# Patient Record
Sex: Male | Born: 1970 | Race: White | Hispanic: No | Marital: Married | Smoking: Never smoker
Health system: Southern US, Community
[De-identification: ages and names within clinical notes are randomized; demographics above are authoritative.]

## PROBLEM LIST (undated history)

## (undated) DIAGNOSIS — I10 Essential (primary) hypertension: Secondary | ICD-10-CM

## (undated) DIAGNOSIS — K219 Gastro-esophageal reflux disease without esophagitis: Secondary | ICD-10-CM

## (undated) HISTORY — DX: Gastro-esophageal reflux disease without esophagitis: K21.9

## (undated) HISTORY — DX: Essential (primary) hypertension: I10

---

## 2010-03-10 ENCOUNTER — Ambulatory Visit
Admission: RE | Admit: 2010-03-10 | Discharge: 2010-03-10 | Payer: Self-pay | Source: Home / Self Care | Attending: Internal Medicine | Admitting: Internal Medicine

## 2016-07-29 ENCOUNTER — Encounter (INDEPENDENT_AMBULATORY_CARE_PROVIDER_SITE_OTHER): Payer: Self-pay

## 2016-07-29 ENCOUNTER — Ambulatory Visit (INDEPENDENT_AMBULATORY_CARE_PROVIDER_SITE_OTHER): Payer: BLUE CROSS/BLUE SHIELD

## 2016-07-29 ENCOUNTER — Encounter (INDEPENDENT_AMBULATORY_CARE_PROVIDER_SITE_OTHER): Payer: Self-pay | Admitting: Orthopaedic Surgery

## 2016-07-29 ENCOUNTER — Ambulatory Visit (INDEPENDENT_AMBULATORY_CARE_PROVIDER_SITE_OTHER): Payer: BLUE CROSS/BLUE SHIELD | Admitting: Orthopaedic Surgery

## 2016-07-29 VITALS — BP 134/85 | HR 59 | Ht 74.0 in | Wt 210.0 lb

## 2016-07-29 DIAGNOSIS — G5602 Carpal tunnel syndrome, left upper limb: Secondary | ICD-10-CM

## 2016-07-29 DIAGNOSIS — M542 Cervicalgia: Secondary | ICD-10-CM

## 2016-07-29 DIAGNOSIS — G5601 Carpal tunnel syndrome, right upper limb: Secondary | ICD-10-CM | POA: Diagnosis not present

## 2016-07-29 NOTE — Progress Notes (Signed)
Office Visit Note   Patient: Albert Williams           Date of Birth: 1970/12/17           MRN: [id number] Visit Date: 07/29/2016              Requested by: Georgianne Fick, MD [address] [place], [place] [address] PCP: Georgianne Fick, MD   Assessment & Plan: Visit Diagnoses:  1. Neck pain   2. Carpal tunnel syndrome, left upper limb   3. Carpal tunnel syndrome, right upper limb   4.      Low back pain. 5.      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 He wakes him up at night he's used the splints without relief. We'll proceed with nerve conduction velocities since his old the reports are not available. We'll proceed with the cervical MRI scan for evaluation of his increased neck shoulder pain with progressive weakness. He previously had the H&P at C5-6 2013. Follow-up after tests. He expressed that if something to be done help with his hand finger pain wakes him up and 9 as well as his neck pain during the day with work activities then he could be able to decrease narcotic medication that he's been on.  Follow-Up Instructions: Follow-up after NCV and cervical MRI scan  Orders:  Orders Placed This Encounter  Procedures  . XR Cervical Spine 2 or 3 views   No orders of the defined types were placed in this encounter.     Procedures: No procedures performed   Clinical Data: No additional findings.   Subjective: Chief Complaint  Patient presents with  . Neck - Pain    HPI patient is here with problems with chronic hand numbness at night he's had a known diagnosis with carpal tunnel and use night splints in the past he thinks he had the electrical nerve conduction velocities done in [place] many years ago was told he needed surgery. He's put this off. He's had chronic neck pain with shoulder weakness and recently his job change back to maintenance type position and is doing more pulling pushing  lifting activities she is giving him increased neck pain and shoulder pain. MRI scan 2013 cervical spine showed the moderate to large left paracentral HNP at C5-6 with cord compression and at that time surgery was discussed and patient had an epidural and got the improvement and then the common easier job position and decided not to have the surgery. He's had ongoing back problems he's on Suboxone and recent referral from his primary care back to pain clinic and wanted to put him on increasing dosages of narcotic medication and patient states he doesn't want to go back on narcotic medicine. He remains on Suboxone 8/2mg  film dose and is stable. He's also on Neurontin. He takes some Ritalin 20 mg also.  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 Negative for MI. Otherwise negative as it pertains to his history of present illness.   Objective: Vital Signs: BP 134/85   Pulse (!) 59   Ht  (1.88 m)   Wt 210 lb (95.3 kg)   BMI 26.96 kg/m   Physical Exam  Constitutional: He is oriented to person, place, and time. He appears well-developed and well-nourished.  HENT:  Head: Normocephalic and atraumatic.  Eyes: EOM  are normal. Pupils are equal, round, and reactive to light.  Neck: No tracheal deviation present. No thyromegaly present.  Cardiovascular: Normal rate.   Pulmonary/Chest: Effort normal. He has no wheezes.  Abdominal: Soft. Bowel sounds are normal.  Musculoskeletal:  This is pale with carpal compression. No thenar atrophy. Interossei profundus supple my wrist flexion extension are strong. 1+ reflexes. Negative impingement both shoulders. Bilateral brachioplexus tenderness positive Spurling slightly worse on the left than right. Forward flexion chin 2 finger breaths: The chest some increased pain with compression mild improvement with distraction. Good cervical  extension. Biceps triceps is good resistance. Bilateral positive Phalen's test. Ulnar nerve at the elbow was normal. No subluxation negative Tinel's.  Neurological: He is alert and oriented to person, place, and time.  Skin: Skin is warm and dry. Capillary refill takes less than 2 seconds.  Psychiatric: He has a normal mood and affect. His behavior is normal. Judgment and thought content normal.    Ortho Exam  Specialty Comments:  No specialty comments available.  Imaging: Xr Cervical Spine 2 Or 3 Views  Result Date: 07/29/2016 AP lateral cervical spine x-ray shows some loss of mid cervical lordosis. Well-maintained disc space height minimal endplate spurring at C5-6. Impression: Cervical spine with normal disc space height for his age. Some uncovertebral changes on AP x-ray at C5-6.    PMFS History: There are no active problems to display for this patient.  Past Medical History:  Diagnosis Date  . Acid reflux   . Hypertension     No family history on file.  History reviewed. No pertinent surgical history. Social History   Occupational History  . Not on file.   Social History Main Topics  . Smoking status: Never Smoker  . Smokeless tobacco: Never Used  . Alcohol use Yes  . Drug use: No  . Sexual activity: Not on file

## 2016-07-29 NOTE — Addendum Note (Signed)
Addended by: Rogers Seeds on: 07/29/2016 12:49 PM   Modules accepted: Orders

## 2016-08-06 ENCOUNTER — Ambulatory Visit
Admission: RE | Admit: 2016-08-06 | Discharge: 2016-08-06 | Disposition: A | Discharge status: Home / Self Care | Payer: BLUE CROSS/BLUE SHIELD | Source: Ambulatory Visit | Attending: Orthopaedic Surgery | Admitting: Orthopaedic Surgery

## 2016-08-06 DIAGNOSIS — M542 Cervicalgia: Secondary | ICD-10-CM

## 2016-08-13 ENCOUNTER — Encounter (INDEPENDENT_AMBULATORY_CARE_PROVIDER_SITE_OTHER): Payer: BLUE CROSS/BLUE SHIELD | Admitting: Physical Medicine and Rehabilitation

## 2016-08-25 ENCOUNTER — Encounter (INDEPENDENT_AMBULATORY_CARE_PROVIDER_SITE_OTHER): Payer: Self-pay | Admitting: Physical Medicine and Rehabilitation

## 2016-08-25 ENCOUNTER — Ambulatory Visit (INDEPENDENT_AMBULATORY_CARE_PROVIDER_SITE_OTHER): Payer: BLUE CROSS/BLUE SHIELD | Admitting: Physical Medicine and Rehabilitation

## 2016-08-25 DIAGNOSIS — R202 Paresthesia of skin: Secondary | ICD-10-CM | POA: Diagnosis not present

## 2016-08-25 NOTE — Progress Notes (Deleted)
Right hand dominant. Numbness in both hands with swelling. Difficulty sleeping. Both hands are about equal. Reports symptoms are usually in the whole hand- can't tell that any fingers are worse. Symptoms have been present for more than a year.

## 2016-08-26 ENCOUNTER — Encounter (INDEPENDENT_AMBULATORY_CARE_PROVIDER_SITE_OTHER): Payer: Self-pay | Admitting: Physical Medicine and Rehabilitation

## 2016-08-26 NOTE — Progress Notes (Signed)
[name] - 46 y.o. male MRN [id number]  Date of birth: [birth year]  Office Visit Note: Visit Date: 08/25/2016 PCP: Georgianne Fickamachandran, Ajith, MD Referred by: Georgianne Fickamachandran, Ajith, MD  Subjective: Chief Complaint  Patient presents with  . Right Hand - Numbness  . Left Hand - Numbness   HPI: [name] is a 46 year old right-hand dominant gentleman that we've seen in the remote past for cervical epidural injection which helped quite a bit. He is followed by Dr. Ophelia CharterYates. 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 He reports worsening severe at times bilateral hand numbness and tingling and pain. He also reports swelling at times. His symptoms are worse at night and with driving and talking on the phone. He has difficulty sleeping. He reports that both hands are pretty equal. He says at times he usually feels like it's the whole hand is asleep. When you really asking to think about the distribution is more the radial digits. His symptoms are been going on more than a year. Dr. Ophelia CharterYates did update his cervical spine MRI which is reviewed below. He's had no prior cervical surgery or electrodiagnostic studies.    ROS Otherwise per HPI.  Assessment & Plan: Visit Diagnoses:  1. Paresthesia of skin     Plan: No additional findings.  Impression: The above electrodiagnostic study is ABNORMAL and reveals evidence of a moderate RIGHT worse than LEFT bilateral median nerve entrapment at the wrist (carpal tunnel syndrome) affecting sensory and motor components.   There is no significant electrodiagnostic evidence of any other focal nerve entrapment, brachial plexopathy, cervical radiculopathy or generalized peripheral neuropathy.   Recommendations: 1.  Follow-up with referring physician. 2.  Continue current management of symptoms. 3.  Continue use of resting splint at night-time and as needed during the day. 4.  Suggest surgical evaluation.   Meds &  Orders: No orders of the defined types were placed in this encounter.   Orders Placed This Encounter  Procedures  . NCV with EMG (electromyography)    Follow-up: Return for Dr. Ophelia CharterYates.   Procedures: No procedures performed  EMG & NCV Findings: Evaluation of the left median motor and the right median motor nerves showed prolonged distal onset latency (L4.4, R5.5 ms) and decreased conduction velocity (Elbow-Wrist, L48, R49 m/s).  The left median (across palm) sensory nerve showed prolonged distal peak latency (Wrist, 4.3 ms).  The right median (across palm) sensory nerve showed prolonged distal peak latency (Wrist, 5.2 ms) and prolonged distal peak latency (Palm, 2.1 ms).  All remaining nerves (as indicated in the following tables) were within normal limits.  Left vs. Right side comparison data for the median motor nerve indicates abnormal L-R latency difference (1.1 ms).  All remaining left vs. right side differences were within normal limits.    All examined muscles (as indicated in the following table) showed no evidence of electrical instability.    Impression: The above electrodiagnostic study is ABNORMAL and reveals evidence of a moderate RIGHT worse than LEFT bilateral median nerve entrapment at the wrist (carpal tunnel syndrome) affecting sensory and motor components.   There is no significant electrodiagnostic evidence of any other focal nerve entrapment, brachial plexopathy, cervical radiculopathy or generalized peripheral neuropathy.   Recommendations: 1.  Follow-up with referring physician. 2.  Continue current management of symptoms. 3.  Continue use of resting splint at night-time and as needed during the day. 4.  Suggest surgical evaluation.    Nerve Conduction Studies  Anti Sensory Summary Table   Stim Site NR Peak (ms) Norm Peak (ms) P-T Amp (V) Norm P-T Amp Site1 Site2 Delta-P (ms) Dist (cm) Vel (m/s) Norm Vel (m/s)  Left Median Acr Palm Anti Sensory (2nd Digit)  32.3C   Wrist    *4.3 <3.6 22.9 >10 Wrist Palm 2.5 0.0    Palm    1.8 <2.0 25.5         Right Median Acr Palm Anti Sensory (2nd Digit)  31.7C  Wrist    *5.2 <3.6 22.5 >10 Wrist Palm 3.1 0.0    Palm    *2.1 <2.0 4.9         Left Radial Anti Sensory (Base 1st Digit)  32C  Wrist    2.2 <3.1 20.4  Wrist Base 1st Digit 2.2 0.0    Right Radial Anti Sensory (Base 1st Digit)  33.4C  Wrist    2.2 <3.1 26.0  Wrist Base 1st Digit 2.2 0.0    Left Ulnar Anti Sensory (5th Digit)  32.3C  Wrist    3.3 <3.7 18.5 >15.0 Wrist 5th Digit 3.3 14.0 42 >38  Right Ulnar Anti Sensory (5th Digit)  32.3C  Wrist    3.3 <3.7 17.6 >15.0 Wrist 5th Digit 3.3 14.0 42 >38   Motor Summary Table   Stim Site NR Onset (ms) Norm Onset (ms) O-P Amp (mV) Norm O-P Amp Site1 Site2 Delta-0 (ms) Dist (cm) Vel (m/s) Norm Vel (m/s)  Left Median Motor (Abd Poll Brev)  32.1C  Wrist    *4.4 <4.2 6.5 >5 Elbow Wrist 5.4 26.0 *48 >50  Elbow    9.8  6.4         Right Median Motor (Abd Poll Brev)  33.4C  Wrist    *5.5 <4.2 7.6 >5 Elbow Wrist 5.5 27.0 *49 >50  Elbow    11.0  7.3         Left Ulnar Motor (Abd Dig Min)  32.3C  Wrist    3.3 <4.2 7.6 >3 B Elbow Wrist 4.1 26.5 65 >53  B Elbow    7.4  7.3  A Elbow B Elbow 1.9 11.0 58 >53  A Elbow    9.3  7.0         Right Ulnar Motor (Abd Dig Min)  32.5C  Wrist    2.9 <4.2 7.0 >3 B Elbow Wrist 4.9 28.0 57 >53  B Elbow    7.8  6.7  A Elbow B Elbow 2.0 11.0 55 >53  A Elbow    9.8  6.3          EMG   Side Muscle Nerve Root Ins Act Fibs Psw Amp Dur Poly Recrt Int Dennie Bible Comment  Left Abd Poll Brev Median C8-T1 Nml Nml Nml Nml Nml 0 Nml Nml   Left 1stDorInt Ulnar C8-T1 Nml Nml Nml Nml Nml 0 Nml Nml   Left PronatorTeres Median C6-7 Nml Nml Nml Nml Nml 0 Nml Nml   Left Biceps Musculocut C5-6 Nml Nml Nml Nml Nml 0 Nml Nml   Left Deltoid Axillary C5-6 Nml Nml Nml Nml Nml 0 Nml Nml     Nerve Conduction Studies Anti Sensory Left/Right Comparison   Stim Site L Lat (ms) R Lat (ms) L-R Lat (ms) L  Amp (V) R Amp (V) L-R Amp (%) Site1 Site2 L Vel (m/s) R Vel (m/s) L-R Vel (m/s)  Median Acr Palm Anti Sensory (2nd Digit)  32.3C  Wrist *4.3 *5.2 0.9 22.9 22.5 1.7  Wrist Palm     Palm 1.8 *2.1 0.3 25.5 4.9 80.8       Radial Anti Sensory (Base 1st Digit)  32C  Wrist 2.2 2.2 0.0 20.4 26.0 21.5 Wrist Base 1st Digit     Ulnar Anti Sensory (5th Digit)  32.3C  Wrist 3.3 3.3 0.0 18.5 17.6 4.9 Wrist 5th Digit 42 42 0   Motor Left/Right Comparison   Stim Site L Lat (ms) R Lat (ms) L-R Lat (ms) L Amp (mV) R Amp (mV) L-R Amp (%) Site1 Site2 L Vel (m/s) R Vel (m/s) L-R Vel (m/s)  Median Motor (Abd Poll Brev)  32.1C  Wrist *4.4 *5.5 *1.1 6.5 7.6 14.5 Elbow Wrist *48 *49 1  Elbow 9.8 11.0 1.2 6.4 7.3 12.3       Ulnar Motor (Abd Dig Min)  32.3C  Wrist 3.3 2.9 0.4 7.6 7.0 7.9 B Elbow Wrist 65 57 8  B Elbow 7.4 7.8 0.4 7.3 6.7 8.2 A Elbow B Elbow 58 55 3  A Elbow 9.3 9.8 0.5 7.0 6.3 10.0             Clinical History: Cervical spine MRI 08/14/2016  Disc levels:  C2-3: No significant disc displacement, foraminal narrowing, or canal stenosis.  C3-4: No significant disc displacement, foraminal narrowing, or canal stenosis.  C4-5: No significant disc displacement, foraminal narrowing, or canal stenosis.  C5-6:  Left subarticular and foraminal disc protrusion measuring up to 4 mm resulting in left anterior cord impingement with flattening, mild canal stenosis, and moderate to severe left foraminal narrowing.  C6-7: No significant disc displacement, foraminal narrowing, or canal stenosis.  C7-T1: No significant disc displacement, foraminal narrowing, or canal stenosis.  IMPRESSION: 1. C5-6 left subarticular and foraminal disc protrusion with left anterior cord impingement, mild canal stenosis, and moderate to severe left foraminal narrowing. 2. Otherwise no significant foraminal narrowing or canal stenosis. 3. No acute osseous abnormality.  He reports that he has never  smoked. He has never used smokeless tobacco. No results for input(s): HGBA1C, LABURIC in the last 8760 hours.  Objective:  VS:  HT:    WT:   BMI:     BP:   HR: bpm  TEMP: ( )  RESP:  Physical Exam  Musculoskeletal:  Inspection reveals no atrophy of the bilateral APB or FDI or hand intrinsics. There is no swelling, color changes, allodynia or dystrophic changes. There is 5 out of 5 strength in the bilateral wrist extension, finger abduction and long finger flexion. There is intact sensation to light touch in all dermatomal and peripheral nerve distributions. There is a positive Phalen's test bilaterally. There is a negative Hoffmann's test bilaterally.    Ortho Exam Imaging: No results found.  Past Medical/Family/Surgical/Social History: Medications & Allergies reviewed per EMR There are no active problems to display for this patient.  Past Medical History:  Diagnosis Date  . Acid reflux   . Hypertension    History reviewed. No pertinent family history. History reviewed. No pertinent surgical history. Social History   Occupational History  . Not on file.   Social History Main Topics  . Smoking status: Never Smoker  . Smokeless tobacco: Never Used  . Alcohol use Yes  . Drug use: No  . Sexual activity: Not on file

## 2016-08-26 NOTE — Procedures (Signed)
EMG & NCV Findings: Evaluation of the left median motor and the right median motor nerves showed prolonged distal onset latency (L4.4, R5.5 ms) and decreased conduction velocity (Elbow-Wrist, L48, R49 m/s).  The left median (across palm) sensory nerve showed prolonged distal peak latency (Wrist, 4.3 ms).  The right median (across palm) sensory nerve showed prolonged distal peak latency (Wrist, 5.2 ms) and prolonged distal peak latency (Palm, 2.1 ms).  All remaining nerves (as indicated in the following tables) were within normal limits.  Left vs. Right side comparison data for the median motor nerve indicates abnormal L-R latency difference (1.1 ms).  All remaining left vs. right side differences were within normal limits.    All examined muscles (as indicated in the following table) showed no evidence of electrical instability.    Impression: The above electrodiagnostic study is ABNORMAL and reveals evidence of a moderate RIGHT worse than LEFT bilateral median nerve entrapment at the wrist (carpal tunnel syndrome) affecting sensory and motor components.   There is no significant electrodiagnostic evidence of any other focal nerve entrapment, brachial plexopathy, cervical radiculopathy or generalized peripheral neuropathy.   Recommendations: 1.  Follow-up with referring physician. 2.  Continue current management of symptoms. 3.  Continue use of resting splint at night-time and as needed during the day. 4.  Suggest surgical evaluation.    Nerve Conduction Studies Anti Sensory Summary Table   Stim Site NR Peak (ms) Norm Peak (ms) P-T Amp (V) Norm P-T Amp Site1 Site2 Delta-P (ms) Dist (cm) Vel (m/s) Norm Vel (m/s)  Left Median Acr Palm Anti Sensory (2nd Digit)  32.3C  Wrist    *4.3 <3.6 22.9 >10 Wrist Palm 2.5 0.0    Palm    1.8 <2.0 25.5         Right Median Acr Palm Anti Sensory (2nd Digit)  31.7C  Wrist    *5.2 <3.6 22.5 >10 Wrist Palm 3.1 0.0    Palm    *2.1 <2.0 4.9         Left  Radial Anti Sensory (Base 1st Digit)  32C  Wrist    2.2 <3.1 20.4  Wrist Base 1st Digit 2.2 0.0    Right Radial Anti Sensory (Base 1st Digit)  33.4C  Wrist    2.2 <3.1 26.0  Wrist Base 1st Digit 2.2 0.0    Left Ulnar Anti Sensory (5th Digit)  32.3C  Wrist    3.3 <3.7 18.5 >15.0 Wrist 5th Digit 3.3 14.0 42 >38  Right Ulnar Anti Sensory (5th Digit)  32.3C  Wrist    3.3 <3.7 17.6 >15.0 Wrist 5th Digit 3.3 14.0 42 >38   Motor Summary Table   Stim Site NR Onset (ms) Norm Onset (ms) O-P Amp (mV) Norm O-P Amp Site1 Site2 Delta-0 (ms) Dist (cm) Vel (m/s) Norm Vel (m/s)  Left Median Motor (Abd Poll Brev)  32.1C  Wrist    *4.4 <4.2 6.5 >5 Elbow Wrist 5.4 26.0 *48 >50  Elbow    9.8  6.4         Right Median Motor (Abd Poll Brev)  33.4C  Wrist    *5.5 <4.2 7.6 >5 Elbow Wrist 5.5 27.0 *49 >50  Elbow    11.0  7.3         Left Ulnar Motor (Abd Dig Min)  32.3C  Wrist    3.3 <4.2 7.6 >3 B Elbow Wrist 4.1 26.5 65 >53  B Elbow    7.4  7.3  A Elbow B  Elbow 1.9 11.0 58 >53  A Elbow    9.3  7.0         Right Ulnar Motor (Abd Dig Min)  32.5C  Wrist    2.9 <4.2 7.0 >3 B Elbow Wrist 4.9 28.0 57 >53  B Elbow    7.8  6.7  A Elbow B Elbow 2.0 11.0 55 >53  A Elbow    9.8  6.3          EMG   Side Muscle Nerve Root Ins Act Fibs Psw Amp Dur Poly Recrt Int Dennie Bible Comment  Left Abd Poll Brev Median C8-T1 Nml Nml Nml Nml Nml 0 Nml Nml   Left 1stDorInt Ulnar C8-T1 Nml Nml Nml Nml Nml 0 Nml Nml   Left PronatorTeres Median C6-7 Nml Nml Nml Nml Nml 0 Nml Nml   Left Biceps Musculocut C5-6 Nml Nml Nml Nml Nml 0 Nml Nml   Left Deltoid Axillary C5-6 Nml Nml Nml Nml Nml 0 Nml Nml     Nerve Conduction Studies Anti Sensory Left/Right Comparison   Stim Site L Lat (ms) R Lat (ms) L-R Lat (ms) L Amp (V) R Amp (V) L-R Amp (%) Site1 Site2 L Vel (m/s) R Vel (m/s) L-R Vel (m/s)  Median Acr Palm Anti Sensory (2nd Digit)  32.3C  Wrist *4.3 *5.2 0.9 22.9 22.5 1.7 Wrist Palm     Palm 1.8 *2.1 0.3 25.5 4.9 80.8        Radial Anti Sensory (Base 1st Digit)  32C  Wrist 2.2 2.2 0.0 20.4 26.0 21.5 Wrist Base 1st Digit     Ulnar Anti Sensory (5th Digit)  32.3C  Wrist 3.3 3.3 0.0 18.5 17.6 4.9 Wrist 5th Digit 42 42 0   Motor Left/Right Comparison   Stim Site L Lat (ms) R Lat (ms) L-R Lat (ms) L Amp (mV) R Amp (mV) L-R Amp (%) Site1 Site2 L Vel (m/s) R Vel (m/s) L-R Vel (m/s)  Median Motor (Abd Poll Brev)  32.1C  Wrist *4.4 *5.5 *1.1 6.5 7.6 14.5 Elbow Wrist *48 *49 1  Elbow 9.8 11.0 1.2 6.4 7.3 12.3       Ulnar Motor (Abd Dig Min)  32.3C  Wrist 3.3 2.9 0.4 7.6 7.0 7.9 B Elbow Wrist 65 57 8  B Elbow 7.4 7.8 0.4 7.3 6.7 8.2 A Elbow B Elbow 58 55 3  A Elbow 9.3 9.8 0.5 7.0 6.3 10.0

## 2016-09-01 ENCOUNTER — Ambulatory Visit (INDEPENDENT_AMBULATORY_CARE_PROVIDER_SITE_OTHER): Payer: BLUE CROSS/BLUE SHIELD | Admitting: Orthopaedic Surgery

## 2018-05-07 IMAGING — MR MR CERVICAL SPINE W/O CM
4 of 5 series · 27 of 48 positions shown · non-contrast
Comparison: 07/29/2016 cervical radiographs

ADDENDUM:
Correction to dictation sections "Disk levels" and "IMPRESSION":

Disc levels:
C2-3: No significant disc displacement, foraminal narrowing, or
canal stenosis.
C3-4: No significant disc displacement, foraminal narrowing, or
C4-5: No significant disc displacement, foraminal narrowing, or
C5-6:  Left subarticular and foraminal disc protrusion measuring up
to 4 mm resulting in left anterior cord impingement with flattening,
mild
canal stenosis, and moderate to severe left foraminal narrowing.
C6-7: No significant disc displacement, foraminal narrowing, or
C7-T1: No significant disc displacement, foraminal narrowing, or
CLINICAL DATA: 46 y/o  M; neck pain radiating to both shoulders.
EXAM:
MRI CERVICAL SPINE WITHOUT CONTRAST
TECHNIQUE: Multiplanar, multisequence MR imaging of the cervical spine was
performed. No intravenous contrast was administered.

[Series 6: T1 · sagittal · 3.0mm · 0.66mm/px · 7 of 15 slices shown]
[im 1/15]
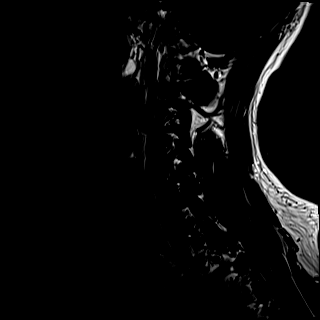
[im 3/15]
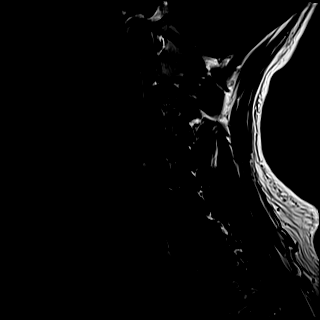
[im 5/15]
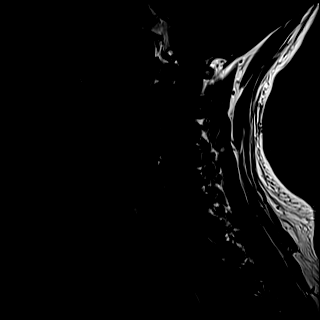
[im 8/15]
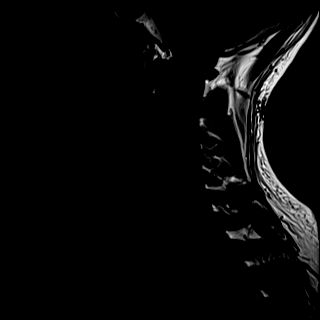
[im 10/15]
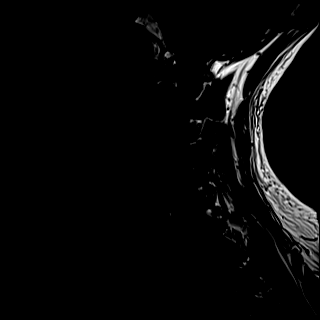
[im 12/15]
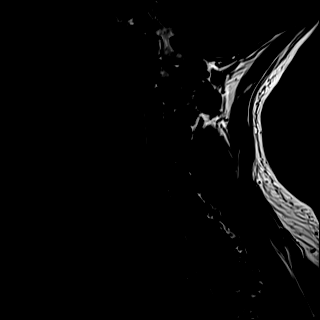
[im 15/15]
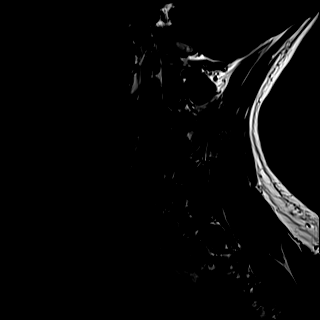

[Series 7: T2 · sagittal · 3.0mm · 0.55mm/px · 7 of 15 slices shown (1 of 2)]
[im 1/15]
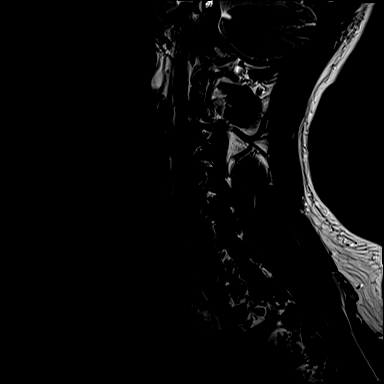
[im 3/15]
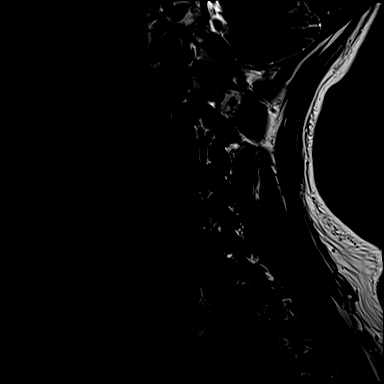
[im 5/15]
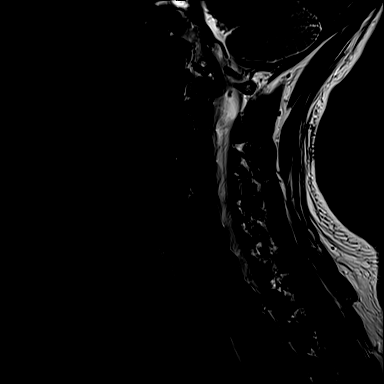
[im 8/15]
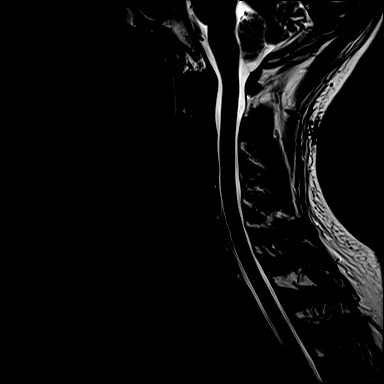
[im 10/15]
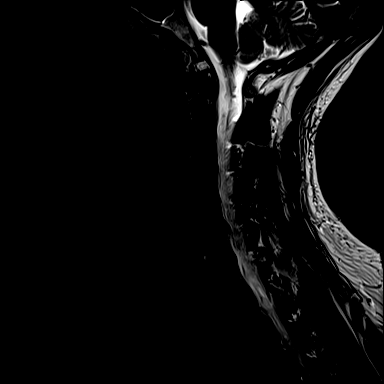
[im 12/15]
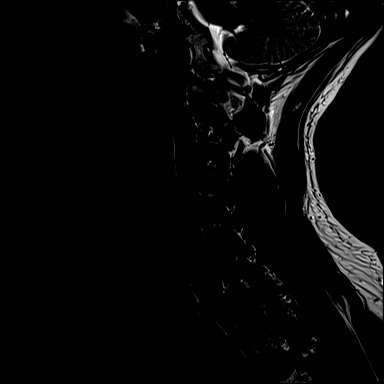
[im 15/15]
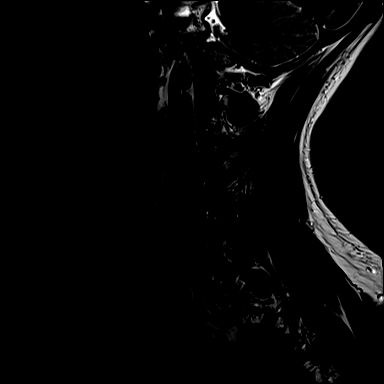

[Series 8: STIR · sagittal · 3.0mm · 0.33mm/px · 5 of 15 slices shown]
[im 1/15]
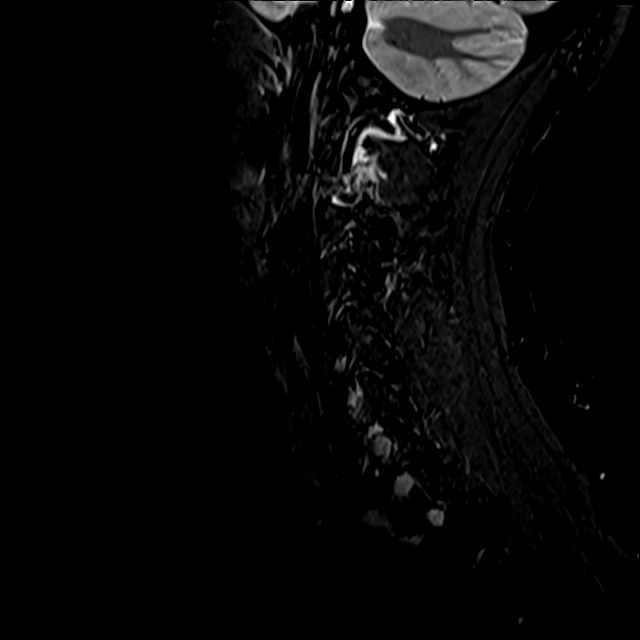
[im 3/15]
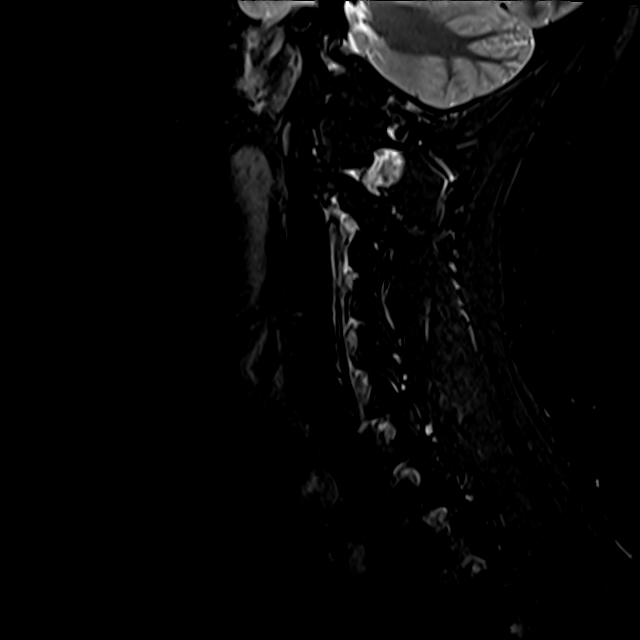
[im 6/15]
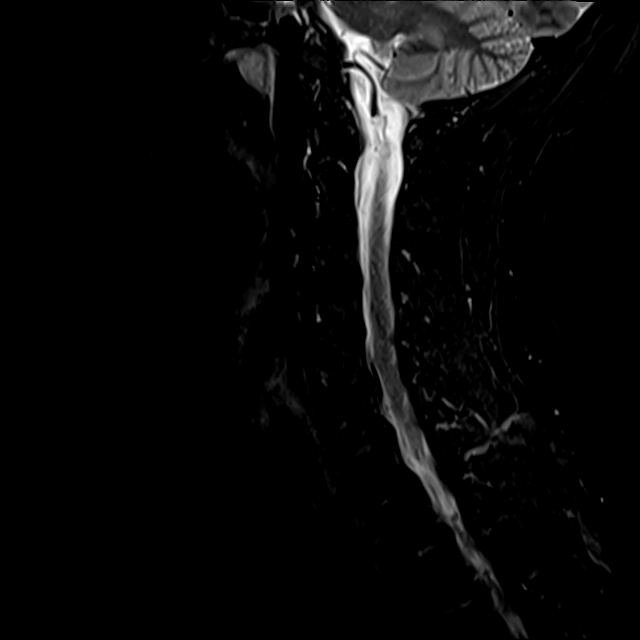
[im 9/15]
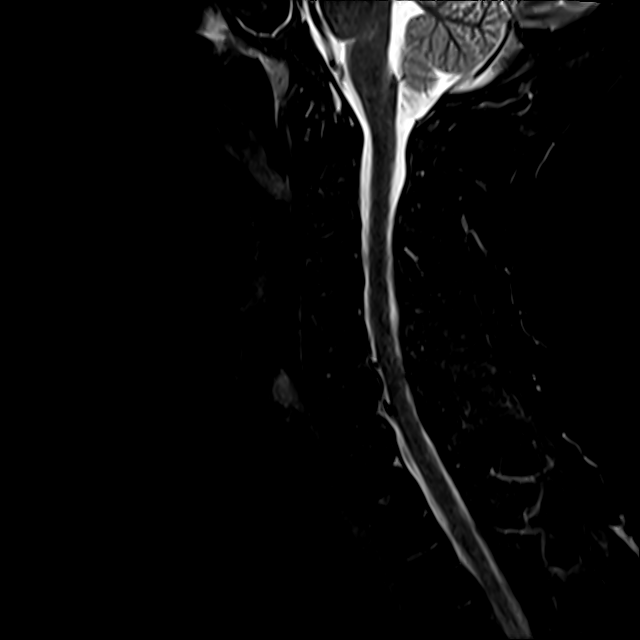
[im 15/15]
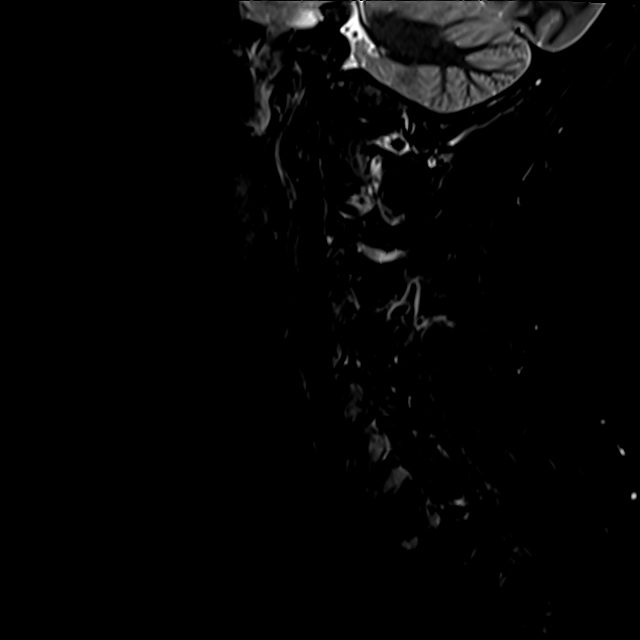

[Series 9: T2 · axial · 3.0mm · 0.50mm/px · z∈[-65,+38]mm · 8 of 33 slices shown (2 of 2)]
[im 1/33]
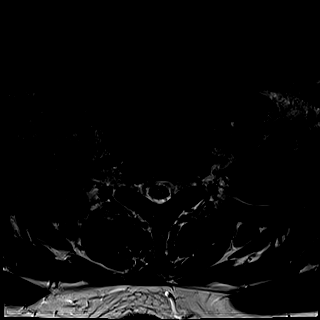
[im 5/33]
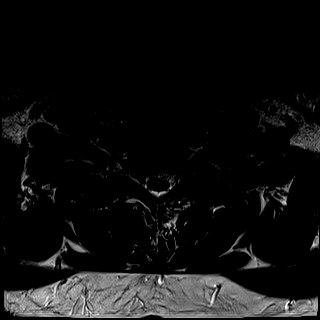
[im 10/33]
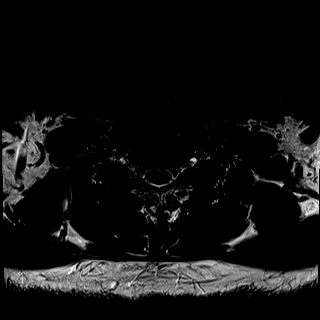
[im 15/33]
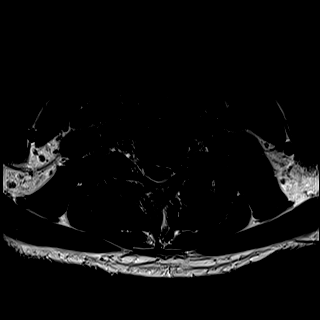
[im 18/33]
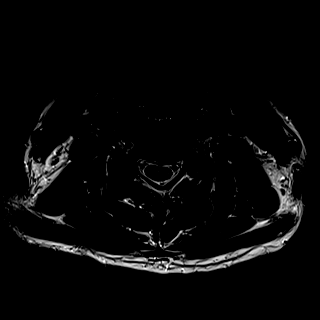
[im 23/33]
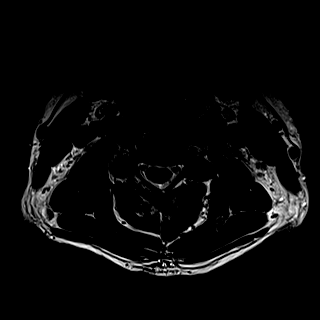
[im 28/33]
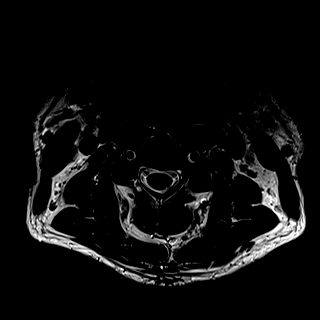
[im 33/33]
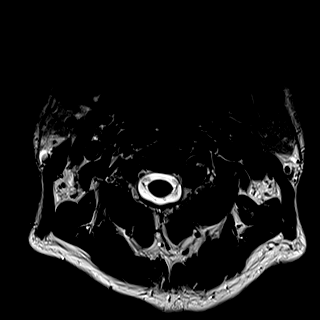

[27 of 48 positions shown; findings below may reference images not displayed]

IMPRESSION: 1. C5-6 left subarticular and foraminal disc protrusion with left
anterior cord impingement, mild canal stenosis, and moderate to
severe left foraminal narrowing.
2. Otherwise no significant foraminal narrowing or canal stenosis.
3. No acute osseous abnormality.

By: Eulender Plaaitjies M.D.
FINDINGS: Alignment: Physiologic.

Vertebrae: No fracture, evidence of discitis, or bone lesion.

Cord: Normal signal and morphology.

Posterior Fossa, vertebral arteries, paraspinal tissues: Negative.

Disc levels:

C2-3: No significant disc displacement, foraminal narrowing, or
canal stenosis.

C3-4: No significant disc displacement, foraminal narrowing, or
canal stenosis.

C4-5: No significant disc displacement, foraminal narrowing, or
canal stenosis.

C5-6: No significant disc displacement, foraminal narrowing, or
canal stenosis.

C6-7: Left subarticular and foraminal disc protrusion measuring up
to 4 mm with left anterior cord impingement with flattening, mild
canal stenosis, and moderate to severe left foraminal narrowing.

C7-T1: No significant disc displacement, foraminal narrowing, or
canal stenosis.
IMPRESSION: 1. C6-7 left subarticular and foraminal disc protrusion with left
anterior cord impingement, mild canal stenosis, and moderate to
severe left foraminal narrowing.
2. Otherwise no significant foraminal narrowing or canal stenosis.
3. No acute osseous abnormality.

By: Eulender Plaaitjies M.D.
